# Patient Record
Sex: Male | Born: 1970 | Race: Black or African American | Hispanic: No | Marital: Single | Smoking: Current some day smoker
Health system: Southern US, Community
[De-identification: ages and names within clinical notes are randomized; demographics above are authoritative.]

---

## 2011-07-24 ENCOUNTER — Emergency Department: Payer: Self-pay | Admitting: Unknown Physician Specialty

## 2013-10-27 ENCOUNTER — Emergency Department: Payer: Self-pay | Admitting: Emergency Medicine

## 2016-06-26 ENCOUNTER — Emergency Department
Admission: EM | Admit: 2016-06-26 | Discharge: 2016-06-26 | Disposition: A | Discharge status: Home / Self Care | Payer: BLUE CROSS/BLUE SHIELD | Attending: Emergency Medicine | Admitting: Emergency Medicine

## 2016-06-26 ENCOUNTER — Emergency Department: Payer: BLUE CROSS/BLUE SHIELD

## 2016-06-26 ENCOUNTER — Encounter: Payer: Self-pay | Admitting: Emergency Medicine

## 2016-06-26 DIAGNOSIS — K2970 Gastritis, unspecified, without bleeding: Secondary | ICD-10-CM | POA: Insufficient documentation

## 2016-06-26 DIAGNOSIS — F17200 Nicotine dependence, unspecified, uncomplicated: Secondary | ICD-10-CM | POA: Insufficient documentation

## 2016-06-26 DIAGNOSIS — R1013 Epigastric pain: Secondary | ICD-10-CM

## 2016-06-26 DIAGNOSIS — R1010 Upper abdominal pain, unspecified: Secondary | ICD-10-CM | POA: Diagnosis present

## 2016-06-26 LAB — CBC
HCT: 48 % (ref 40.0–52.0)
Hemoglobin: 16.4 g/dL (ref 13.0–18.0)
MCH: 33.8 pg (ref 26.0–34.0)
MCHC: 34.1 g/dL (ref 32.0–36.0)
MCV: 99.1 fL (ref 80.0–100.0)
PLATELETS: 229 10*3/uL (ref 150–440)
RBC: 4.84 MIL/uL (ref 4.40–5.90)
RDW: 15.1 % — AB (ref 11.5–14.5)
WBC: 7.7 10*3/uL (ref 3.8–10.6)

## 2016-06-26 LAB — URINALYSIS COMPLETE WITH MICROSCOPIC (ARMC ONLY)
Bacteria, UA: NONE SEEN
Bilirubin Urine: NEGATIVE
Glucose, UA: NEGATIVE mg/dL
Ketones, ur: NEGATIVE mg/dL
Leukocytes, UA: NEGATIVE
Nitrite: NEGATIVE
PH: 5 (ref 5.0–8.0)
PROTEIN: 30 mg/dL — AB
Specific Gravity, Urine: 1.021 (ref 1.005–1.030)

## 2016-06-26 LAB — COMPREHENSIVE METABOLIC PANEL
ALK PHOS: 43 U/L (ref 38–126)
ALT: 20 U/L (ref 17–63)
AST: 22 U/L (ref 15–41)
Albumin: 3.9 g/dL (ref 3.5–5.0)
Anion gap: 8 (ref 5–15)
BUN: 16 mg/dL (ref 6–20)
CO2: 24 mmol/L (ref 22–32)
CREATININE: 1.47 mg/dL — AB (ref 0.61–1.24)
Calcium: 9.1 mg/dL (ref 8.9–10.3)
Chloride: 103 mmol/L (ref 101–111)
GFR, EST NON AFRICAN AMERICAN: 56 mL/min — AB (ref >60)
Glucose, Bld: 99 mg/dL (ref 65–99)
Potassium: 4.1 mmol/L (ref 3.5–5.1)
SODIUM: 135 mmol/L (ref 135–145)
TOTAL PROTEIN: 6.7 g/dL (ref 6.5–8.1)
Total Bilirubin: 0.4 mg/dL (ref 0.3–1.2)

## 2016-06-26 LAB — LIPASE, BLOOD: Lipase: 24 U/L (ref 11–51)

## 2016-06-26 MED ORDER — ONDANSETRON 4 MG PO TBDP: 4.0000 mg | ORAL_TABLET | Freq: Four times a day (QID) | ORAL | 0 refills | Status: AC | PRN

## 2016-06-26 MED ORDER — IOPAMIDOL (ISOVUE-300) INJECTION 61%
100.0000 mL | Freq: Once | INTRAVENOUS | Status: AC | PRN
  Administered 2016-06-26: 100 mL via INTRAVENOUS
  Filled 2016-06-26: qty 100

## 2016-06-26 MED ORDER — ONDANSETRON HCL 4 MG/2ML IJ SOLN
INTRAMUSCULAR | Status: AC
  Administered 2016-06-26: 4 mg via INTRAVENOUS
  Filled 2016-06-26: qty 2

## 2016-06-26 MED ORDER — HYDROCODONE-ACETAMINOPHEN 5-325 MG PO TABS: 1.0000 | ORAL_TABLET | Freq: Four times a day (QID) | ORAL | 0 refills | Status: AC | PRN

## 2016-06-26 MED ORDER — IOPAMIDOL (ISOVUE-300) INJECTION 61%
30.0000 mL | Freq: Once | INTRAVENOUS | Status: AC | PRN
  Administered 2016-06-26: 30 mL via ORAL
  Filled 2016-06-26: qty 30

## 2016-06-26 MED ORDER — MORPHINE SULFATE (PF) 4 MG/ML IV SOLN
4.0000 mg | Freq: Once | INTRAVENOUS | Status: AC
  Administered 2016-06-26: 4 mg via INTRAVENOUS

## 2016-06-26 MED ORDER — ONDANSETRON HCL 4 MG/2ML IJ SOLN
4.0000 mg | Freq: Once | INTRAMUSCULAR | Status: AC
  Administered 2016-06-26: 4 mg via INTRAVENOUS

## 2016-06-26 MED ORDER — MORPHINE SULFATE (PF) 4 MG/ML IV SOLN
INTRAVENOUS | Status: AC
  Administered 2016-06-26: 4 mg via INTRAVENOUS
  Filled 2016-06-26: qty 1

## 2016-06-26 NOTE — Discharge Instructions (Signed)
? ?  Please return to the emergency room right away if you are to develop a fever, severe nausea, your pain becomes severe or worsens, you are unable to keep food down, begin vomiting any dark or bloody fluid, you develop any dark or bloody stools, feel dehydrated, or other new concerns or symptoms arise. ? ?

## 2016-06-26 NOTE — ED Provider Notes (Signed)
[facility] Emergency Department Provider Note   ____________________________________________   First MD Initiated Contact with Patient 06/26/16 2009     (approximate)  I have reviewed the triage vital signs and the nursing notes.   HISTORY  Chief Complaint Abdominal Pain    HPI Brandon Clarke is a 44 y.o. male reports that for the last several years he'll have episodes of severe upper abdominal pain last for 2-3 days, and then they go away he'll feel much better. This is often associated with nausea and vomiting and loose stool. Is not sure what the causes and denies having seen her side and medical care for this in the past.  The patient reports been having ongoing upper abdominal pain which is severe and crampy in nature for about the last week.  He denies bloody vomit, dark stool or blood in his stool. No fevers, occasionally feeling chilled. Does report that sometimes it is made worse after drinking 6 beers. He does not drink beer daily, but about one day or sometimes 2 days a week he will have "a few beers".  Patient thinks that he may have lost about 15-20 pounds over the last 1-2 weeks.  History reviewed. No pertinent past medical history.  There are no active problems to display for this patient.   History reviewed. No pertinent surgical history.  Prior to Admission medications   Medication Sig Start Date End Date Taking? Authorizing Provider  HYDROcodone-acetaminophen (NORCO/VICODIN) 5-325 MG tablet Take 1 tablet by mouth every 6 (six) hours as needed for moderate pain. 06/26/16   Sharyn Creamer, MD  ondansetron (ZOFRAN ODT) 4 MG disintegrating tablet Take 1 tablet (4 mg total) by mouth every 6 (six) hours as needed for nausea or vomiting. 06/26/16   Sharyn Creamer, MD    Allergies Review of patient's allergies indicates no known allergies.  No family history on file.  Social History Social History  Substance Use Topics  . Smoking status:  Current Some Day Smoker  . Smokeless tobacco: Current User  . Alcohol use Yes    Review of Systems Constitutional: No fever. Pill lightheaded at times, "dehydrated" Eyes: No visual changes. ENT: No sore throat. Cardiovascular: Denies chest pain. Respiratory: Denies shortness of breath. Gastrointestinal: No constipation. Genitourinary: Negative for dysuria. Musculoskeletal: Negative for back pain. Skin: Negative for rash. Neurological: Negative for headaches, focal weakness or numbness.  10-point ROS otherwise negative.  ____________________________________________   PHYSICAL EXAM:  VITAL SIGNS: ED Triage Vitals  Enc Vitals Group     BP 06/26/16 1852 119/87     Pulse Rate 06/26/16 1852 97     Resp 06/26/16 1852 18     Temp 06/26/16 1852 98.6 F (37 C)     Temp Source 06/26/16 1852 Oral     SpO2 06/26/16 1852 99 %     Weight 06/26/16 1852 150 lb (68 kg)     Height 06/26/16 1852 6\' 3"  (1.905 m)     Head Circumference --      Peak Flow --      Pain Score 06/26/16 1854 5     Pain Loc --      Pain Edu? --      Excl. in GC? --     Constitutional: Alert and oriented. Well appearing and in no acute distress.Pleasant. Eyes: Conjunctivae are normal. PERRL. EOMI. Head: Atraumatic. Nose: No congestion/rhinnorhea. Mouth/Throat: Mucous membranes are moist.  Oropharynx non-erythematous. Neck: No stridor.   Cardiovascular: Normal rate, regular rhythm.  Grossly normal heart sounds.  Good peripheral circulation. Respiratory: Normal respiratory effort.  No retractions. Lungs CTAB. Gastrointestinal: Soft and nontender throughout the lower abdomen, however moderate tenderness in the epigastrium without rebound or guarding. No distention. No abdominal bruits. No CVA tenderness. Musculoskeletal: No lower extremity tenderness nor edema.  No joint effusions. Neurologic:  Normal speech and language. No gross focal neurologic deficits are appreciated. Skin:  Skin is warm, dry and intact. No  rash noted. Psychiatric: Mood and affect are normal. Speech and behavior are normal.  ____________________________________________   LABS (all labs ordered are listed, but only abnormal results are displayed)  Labs Reviewed  COMPREHENSIVE METABOLIC PANEL - Abnormal; Notable for the following:       Result Value   Creatinine, Ser 1.47 (*)    GFR calc non Af Amer 56 (*)    All other components within normal limits  CBC - Abnormal; Notable for the following:    RDW 15.1 (*)    All other components within normal limits  URINALYSIS COMPLETEWITH MICROSCOPIC ([facility] ONLY) - Abnormal; Notable for the following:    Color, Urine YELLOW (*)    APPearance CLEAR (*)    Hgb urine dipstick 1+ (*)    Protein, ur 30 (*)    Squamous Epithelial / LPF 0-5 (*)    All other components within normal limits  LIPASE, BLOOD   ____________________________________________  EKG   ____________________________________________  RADIOLOGY  Ct Abdomen Pelvis W Contrast  Result Date: 06/26/2016 CLINICAL DATA:  45 year old male with abdominal pain for 1 week. EXAM: CT ABDOMEN AND PELVIS WITH CONTRAST TECHNIQUE: Multidetector CT imaging of the abdomen and pelvis was performed using the standard protocol following bolus administration of intravenous contrast. CONTRAST:  100 cc intravenous Isovue 300 COMPARISON:  None. FINDINGS: Lower chest: Unremarkable Hepatobiliary: The liver and gallbladder are unremarkable. There is no evidence of biliary dilatation. Pancreas: Unremarkable Spleen: Unremarkable Adrenals/Urinary Tract: The kidneys, adrenal glands and bladder are unremarkable. Stomach/Bowel: No bowel obstruction or definite bowel wall thickening. The appendix is not identified but no enlarged tubular structure or pericecal inflammation identified. Vascular/Lymphatic: Unremarkable. No abdominal aortic aneurysm or enlarged lymph nodes. Reproductive: Unremarkable Other: No free fluid, focal collection or  pneumoperitoneum. Musculoskeletal: No acute abnormality. IMPRESSION: No evidence of acute or significant abnormality. Electronically Signed   By: Harmon Pier M.D.   On: 06/26/2016 22:03    ____________________________________________   PROCEDURES  Procedure(s) performed: None  Procedures  Critical Care performed: No  ____________________________________________   INITIAL IMPRESSION / ASSESSMENT AND PLAN / ED COURSE  Pertinent labs & imaging results that were available during my care of the patient were reviewed by me and considered in my medical decision making (see chart for details).  Differential diagnosis includes but is not limited to, abdominal perforation, aortic dissection, cholecystitis, appendicitis, diverticulitis, colitis, esophagitis/gastritis, kidney stone, pyelonephritis, urinary tract infection, aortic aneurysm. All are considered in decision and treatment plan. Based upon the patient's presentation and risk factors, and proceeded with CT scan to further evaluate. Certainly question the possibility of peptic ulcer disease, gastritis, pancreatitis, given the patient's clinical history and exam. Certainly weight loss is a major concern, and I discussed with him the importance of following up with her primary doctor and gastroenterologist as well.   Clinical Course   ----------------------------------------- 10:32 PM on 06/26/2016 -----------------------------------------  CT demonstrates no acute abnormality. Patient reports improvement, resting comfortably. Appears stable, nontoxic. Discussed with the patient and he is agreeable to stop his intermittent alcohol use,  which I suspect may be leading him to have gastritis. He is amenable to setting up follow-up with a doctor, and understands return precautions.  I will prescribe the patient a narcotic pain medicine due to their condition which I anticipate will cause at least moderate pain short term. I discussed with the  patient safe use of narcotic pain medicines, and that they are not to drive, work in dangerous areas, or ever take more than prescribed (no more than 1 pill every 6 hours). We discussed that this is the type of medication that can be  overdosed on and the risks of this type of medicine. Patient is very agreeable to only use as prescribed and to never use more than prescribed.   ____________________________________________   FINAL CLINICAL IMPRESSION(S) / ED DIAGNOSES  Final diagnoses:  Epigastric abdominal pain  Gastritis      NEW MEDICATIONS STARTED DURING THIS VISIT:  New Prescriptions   HYDROCODONE-ACETAMINOPHEN (NORCO/VICODIN) 5-325 MG TABLET    Take 1 tablet by mouth every 6 (six) hours as needed for moderate pain.   ONDANSETRON (ZOFRAN ODT) 4 MG DISINTEGRATING TABLET    Take 1 tablet (4 mg total) by mouth every 6 (six) hours as needed for nausea or vomiting.     Note:  This document was prepared using Dragon voice recognition software and may include unintentional dictation errors.     Sharyn CreamerMark Kimmie Berggren, MD 06/26/16 2233

## 2016-06-26 NOTE — ED Triage Notes (Signed)
Pt with abd pain n/v/d for one week. Also has lost 20lbs in one week.

## 2016-07-28 ENCOUNTER — Ambulatory Visit: Payer: Self-pay | Admitting: Family Medicine

## 2016-07-28 DIAGNOSIS — Z0289 Encounter for other administrative examinations: Secondary | ICD-10-CM

## 2017-07-10 IMAGING — CT CT ABD-PELV W/ CM
2 of 5 series · 16 of 46 positions shown, 18 images · IV contrast (APPLIED)
Comparison: None.

CLINICAL DATA: 45-year-old male with abdominal pain for 1 week.

EXAM:
CT ABDOMEN AND PELVIS WITH CONTRAST
TECHNIQUE: Multidetector CT imaging of the abdomen and pelvis was performed
using the standard protocol following bolus administration of
intravenous contrast.
CONTRAST:  100 cc intravenous Isovue 300

[Series 2: axial st · axial · 0.72mm/px · z∈[-926,-551]mm · 13 of 85 slices shown, 15 images]
[im 5/85  soft-tissue]
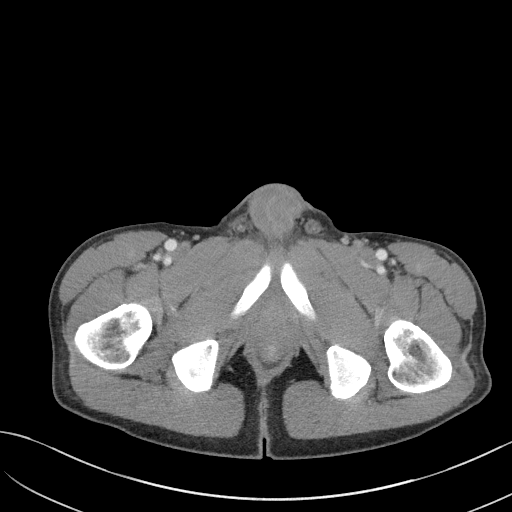
[im 5/85  bone]
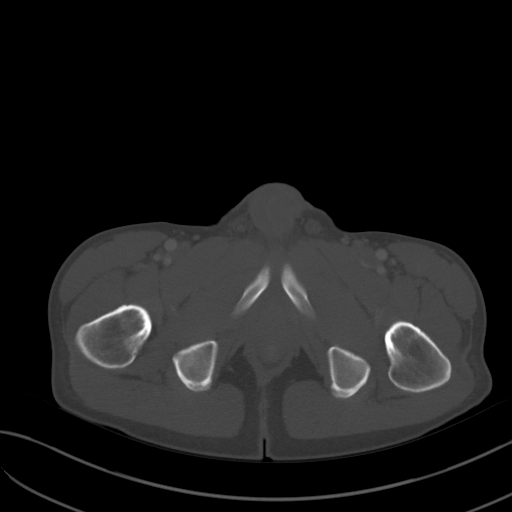
[im 10/85  soft-tissue]
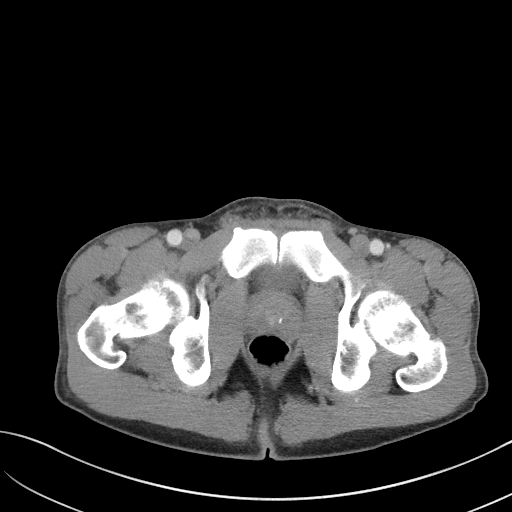
[im 19/85  soft-tissue]
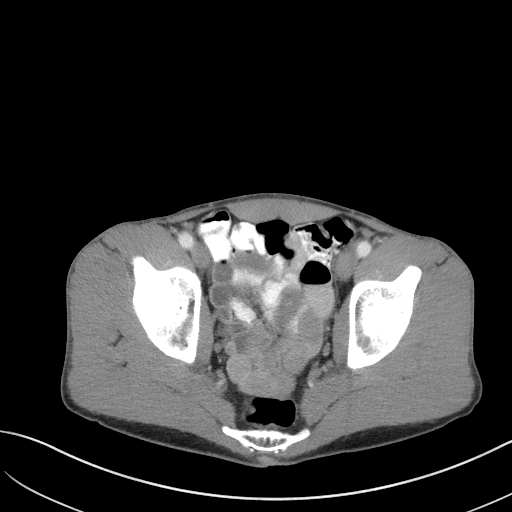
[im 24/85  soft-tissue]
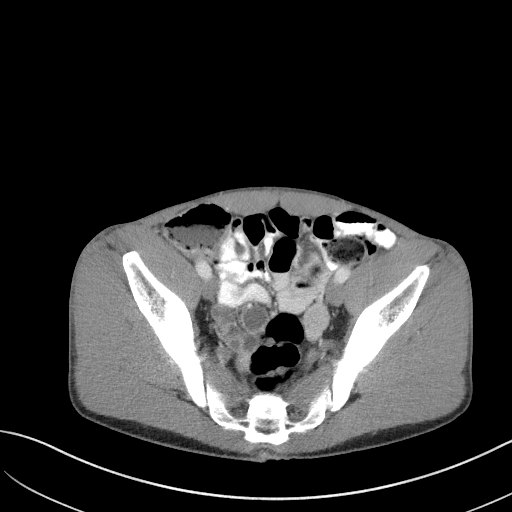
[im 29/85  soft-tissue]
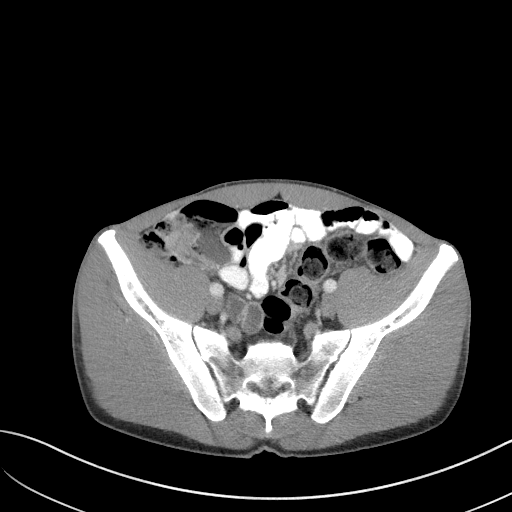
[im 38/85  soft-tissue]
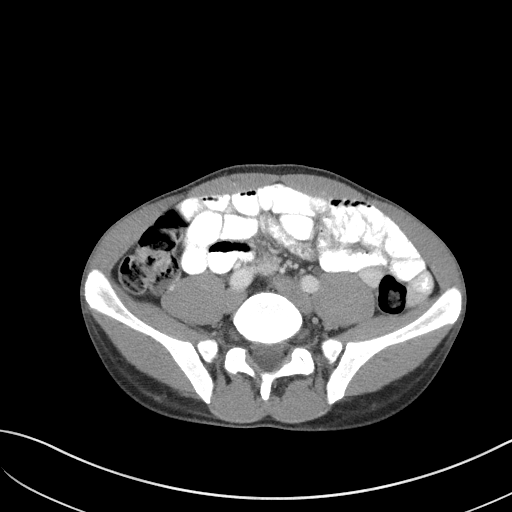
[im 43/85  soft-tissue]
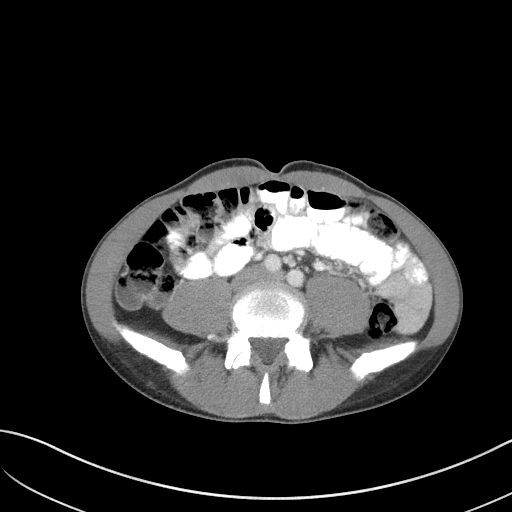
[im 47/85  soft-tissue]
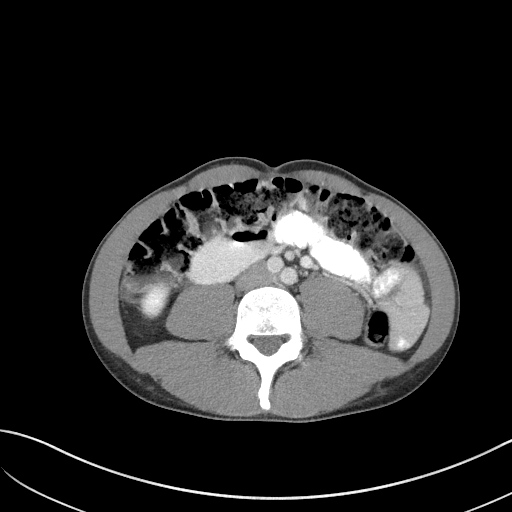
[im 57/85  soft-tissue]
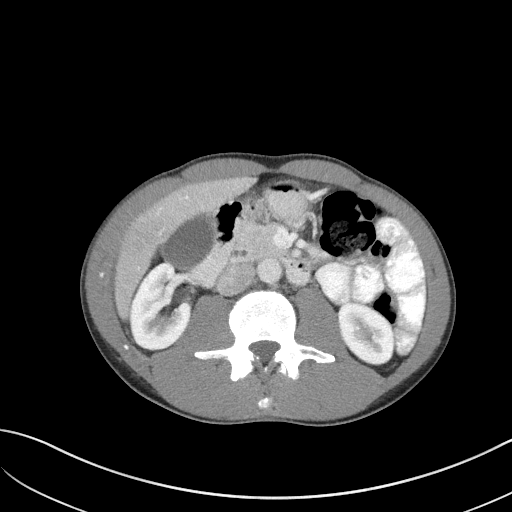
[im 57/85  bone]
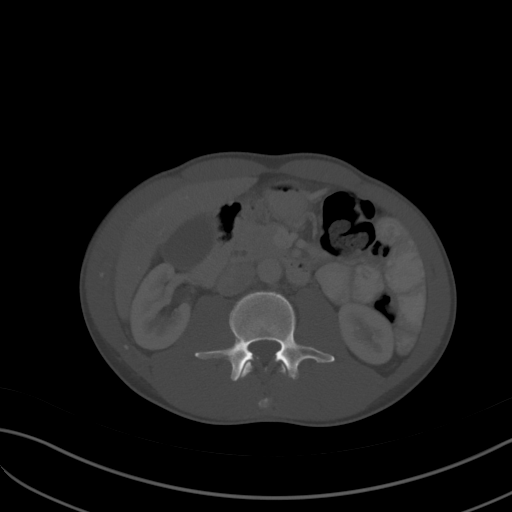
[im 61/85  soft-tissue]
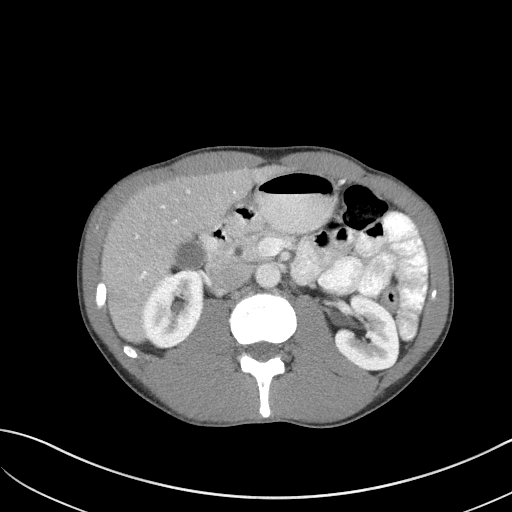
[im 66/85  soft-tissue]
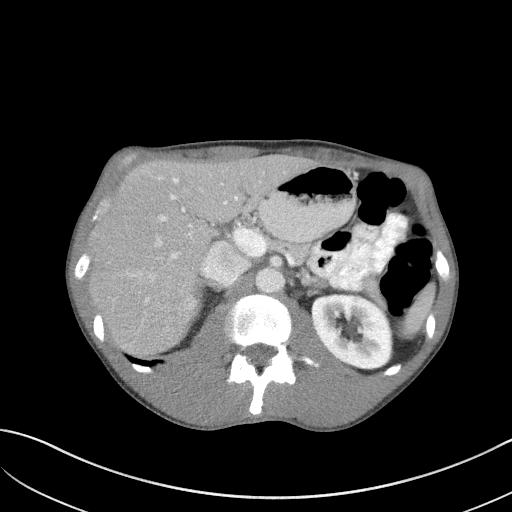
[im 75/85  soft-tissue]
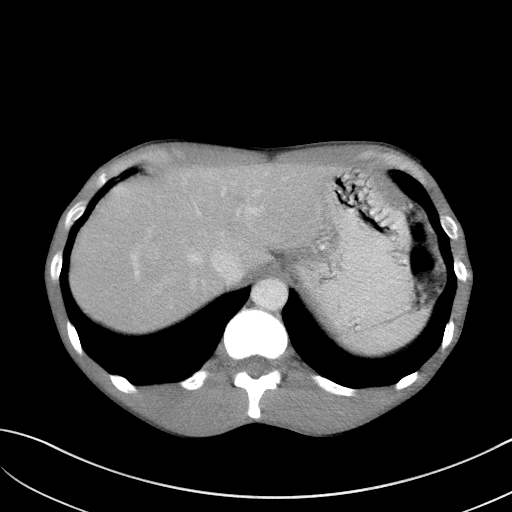
[im 80/85  soft-tissue]
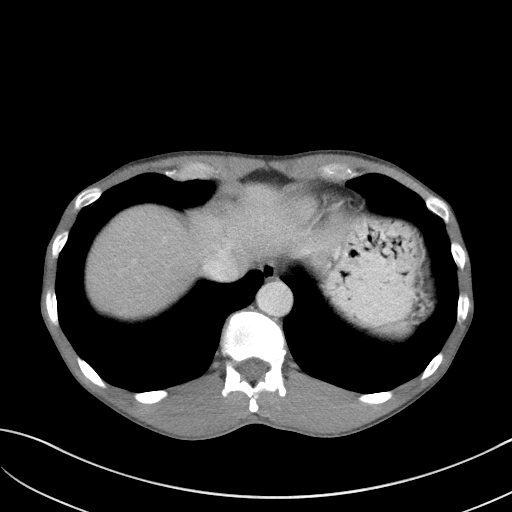

[Series 5: coronal st · coronal · 0.61mm/px · 3 of 77 slices shown]
[im 26/77  soft-tissue]
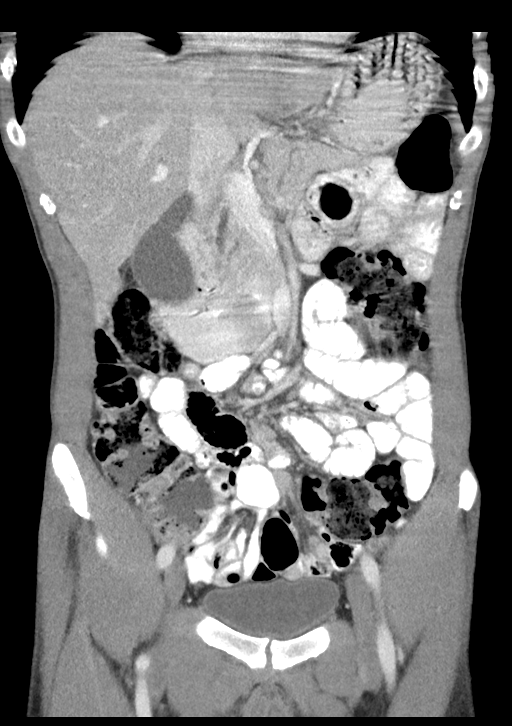
[im 34/77  soft-tissue]
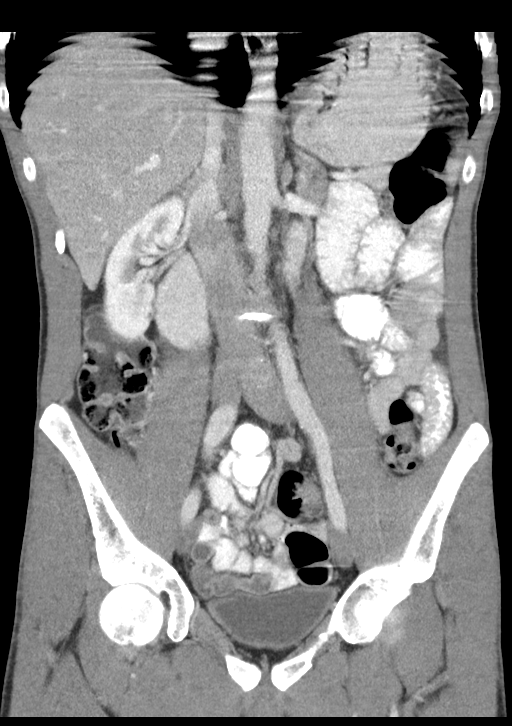
[im 43/77  soft-tissue]
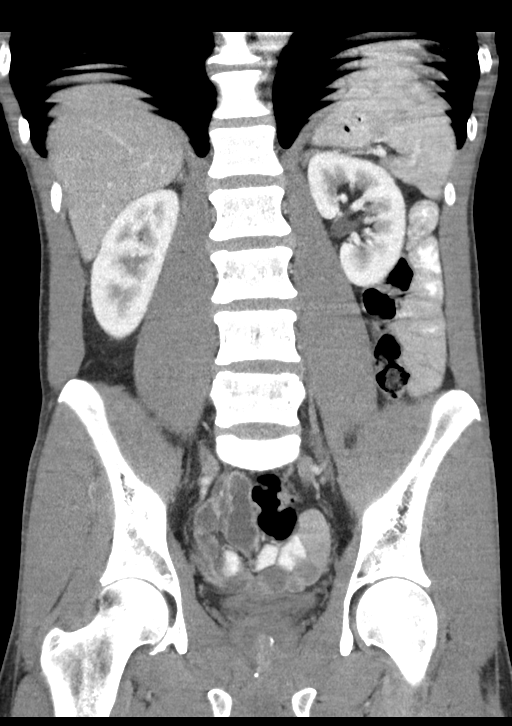

[16 of 46 positions shown; findings below may reference images not displayed]

FINDINGS: Lower chest: Unremarkable

Hepatobiliary: The liver and gallbladder are unremarkable. There is
no evidence of biliary dilatation.

Pancreas: Unremarkable

Spleen: Unremarkable

Adrenals/Urinary Tract: The kidneys, adrenal glands and bladder are
unremarkable.

Stomach/Bowel: No bowel obstruction or definite bowel wall
thickening. The appendix is not identified but no enlarged tubular
structure or pericecal inflammation identified.

Vascular/Lymphatic: Unremarkable. No abdominal aortic aneurysm or
enlarged lymph nodes.

Reproductive: Unremarkable

Other: No free fluid, focal collection or pneumoperitoneum.

Musculoskeletal: No acute abnormality.
IMPRESSION: No evidence of acute or significant abnormality.

## 2023-12-14 DIAGNOSIS — A64 Unspecified sexually transmitted disease: Secondary | ICD-10-CM | POA: Diagnosis not present

## 2023-12-14 DIAGNOSIS — F17210 Nicotine dependence, cigarettes, uncomplicated: Secondary | ICD-10-CM | POA: Diagnosis not present

## 2023-12-14 DIAGNOSIS — Z113 Encounter for screening for infections with a predominantly sexual mode of transmission: Secondary | ICD-10-CM | POA: Diagnosis not present

## 2023-12-14 DIAGNOSIS — N5082 Scrotal pain: Secondary | ICD-10-CM | POA: Diagnosis not present

## 2024-01-05 DIAGNOSIS — Z113 Encounter for screening for infections with a predominantly sexual mode of transmission: Secondary | ICD-10-CM | POA: Diagnosis not present

## 2024-02-07 DIAGNOSIS — E119 Type 2 diabetes mellitus without complications: Secondary | ICD-10-CM | POA: Diagnosis not present

## 2024-02-07 DIAGNOSIS — H40053 Ocular hypertension, bilateral: Secondary | ICD-10-CM | POA: Diagnosis not present

## 2024-02-07 DIAGNOSIS — H52223 Regular astigmatism, bilateral: Secondary | ICD-10-CM | POA: Diagnosis not present

## 2024-02-08 DIAGNOSIS — H5213 Myopia, bilateral: Secondary | ICD-10-CM | POA: Diagnosis not present

## 2024-02-08 DIAGNOSIS — H524 Presbyopia: Secondary | ICD-10-CM | POA: Diagnosis not present

## 2024-08-30 DIAGNOSIS — R079 Chest pain, unspecified: Secondary | ICD-10-CM | POA: Diagnosis not present

## 2024-08-30 DIAGNOSIS — I2510 Atherosclerotic heart disease of native coronary artery without angina pectoris: Secondary | ICD-10-CM | POA: Diagnosis not present
